# Patient Record
Sex: Male | Born: 1990 | Race: Black or African American | Hispanic: No | Marital: Single
Health system: Southern US, Community
[De-identification: ages and names within clinical notes are randomized; demographics above are authoritative.]

---

## 2015-02-22 ENCOUNTER — Emergency Department
Admission: EM | Admit: 2015-02-22 | Discharge: 2015-02-22 | Disposition: A | Discharge status: Home / Self Care | Payer: BLUE CROSS/BLUE SHIELD | Attending: Emergency Medicine | Admitting: Emergency Medicine

## 2015-02-22 ENCOUNTER — Encounter: Payer: Self-pay | Admitting: Emergency Medicine

## 2015-02-22 ENCOUNTER — Other Ambulatory Visit: Payer: Self-pay

## 2015-02-22 ENCOUNTER — Emergency Department: Payer: BLUE CROSS/BLUE SHIELD

## 2015-02-22 DIAGNOSIS — Y998 Other external cause status: Secondary | ICD-10-CM | POA: Insufficient documentation

## 2015-02-22 DIAGNOSIS — S00512A Abrasion of oral cavity, initial encounter: Secondary | ICD-10-CM | POA: Insufficient documentation

## 2015-02-22 DIAGNOSIS — R569 Unspecified convulsions: Secondary | ICD-10-CM | POA: Insufficient documentation

## 2015-02-22 DIAGNOSIS — X58XXXA Exposure to other specified factors, initial encounter: Secondary | ICD-10-CM | POA: Diagnosis not present

## 2015-02-22 DIAGNOSIS — R51 Headache: Secondary | ICD-10-CM | POA: Diagnosis not present

## 2015-02-22 DIAGNOSIS — Y9389 Activity, other specified: Secondary | ICD-10-CM | POA: Diagnosis not present

## 2015-02-22 DIAGNOSIS — Y9289 Other specified places as the place of occurrence of the external cause: Secondary | ICD-10-CM | POA: Diagnosis not present

## 2015-02-22 LAB — URINALYSIS COMPLETE WITH MICROSCOPIC (ARMC ONLY)
Bilirubin Urine: NEGATIVE
GLUCOSE, UA: NEGATIVE mg/dL
HGB URINE DIPSTICK: NEGATIVE
Ketones, ur: NEGATIVE mg/dL
Leukocytes, UA: NEGATIVE
NITRITE: NEGATIVE
Protein, ur: 30 mg/dL — AB
Specific Gravity, Urine: 1.02 (ref 1.005–1.030)
Squamous Epithelial / LPF: NONE SEEN
pH: 7 (ref 5.0–8.0)

## 2015-02-22 LAB — CBC WITH DIFFERENTIAL/PLATELET
Basophils Absolute: 0.1 10*3/uL (ref 0–0.1)
Basophils Relative: 2 %
Eosinophils Absolute: 0 10*3/uL (ref 0–0.7)
Eosinophils Relative: 1 %
HEMATOCRIT: 28.8 % — AB (ref 40.0–52.0)
HEMOGLOBIN: 8.4 g/dL — AB (ref 13.0–18.0)
LYMPHS ABS: 2.4 10*3/uL (ref 1.0–3.6)
Lymphocytes Relative: 45 %
MCH: 19.5 pg — AB (ref 26.0–34.0)
MCHC: 29.3 g/dL — AB (ref 32.0–36.0)
MCV: 66.5 fL — ABNORMAL LOW (ref 80.0–100.0)
Monocytes Absolute: 0.6 10*3/uL (ref 0.2–1.0)
Monocytes Relative: 11 %
NEUTROS ABS: 2.1 10*3/uL (ref 1.4–6.5)
Neutrophils Relative %: 41 %
Platelets: 172 10*3/uL (ref 150–440)
RBC: 4.33 MIL/uL — AB (ref 4.40–5.90)
RDW: 21.6 % — ABNORMAL HIGH (ref 11.5–14.5)
WBC: 5.1 10*3/uL (ref 3.8–10.6)

## 2015-02-22 LAB — URINE DRUG SCREEN, QUALITATIVE (ARMC ONLY)
Amphetamines, Ur Screen: NOT DETECTED
Barbiturates, Ur Screen: NOT DETECTED
Benzodiazepine, Ur Scrn: POSITIVE — AB
Cannabinoid 50 Ng, Ur ~~LOC~~: POSITIVE — AB
Cocaine Metabolite,Ur ~~LOC~~: NOT DETECTED
MDMA (ECSTASY) UR SCREEN: NOT DETECTED
Methadone Scn, Ur: NOT DETECTED
Opiate, Ur Screen: NOT DETECTED
Phencyclidine (PCP) Ur S: NOT DETECTED
TRICYCLIC, UR SCREEN: NOT DETECTED

## 2015-02-22 LAB — COMPREHENSIVE METABOLIC PANEL
ALT: 10 U/L — ABNORMAL LOW (ref 17–63)
ANION GAP: 10 (ref 5–15)
AST: 17 U/L (ref 15–41)
Albumin: 4.3 g/dL (ref 3.5–5.0)
Alkaline Phosphatase: 37 U/L — ABNORMAL LOW (ref 38–126)
BILIRUBIN TOTAL: 0.6 mg/dL (ref 0.3–1.2)
BUN: 11 mg/dL (ref 6–20)
CO2: 25 mmol/L (ref 22–32)
CREATININE: 0.97 mg/dL (ref 0.61–1.24)
Calcium: 9.1 mg/dL (ref 8.9–10.3)
Chloride: 104 mmol/L (ref 101–111)
GFR calc non Af Amer: >60 mL/min (ref >60)
Glucose, Bld: 100 mg/dL — ABNORMAL HIGH (ref 65–99)
POTASSIUM: 3.9 mmol/L (ref 3.5–5.1)
Sodium: 139 mmol/L (ref 135–145)
Total Protein: 7.7 g/dL (ref 6.5–8.1)

## 2015-02-22 NOTE — ED Notes (Signed)
Pt arrived via EMS from [facility]. Per patient , seizure lasted a couple of minutes. Fire department on arrival said patient was a little postictal. Pt is alert and oruiented at present time.

## 2015-02-22 NOTE — ED Notes (Signed)
Lab stating they do not have urine sample despite being sent by this RN at 2030. MD aware.

## 2015-02-22 NOTE — ED Notes (Signed)
Pt alert and in NAd at time of d/c to family. Pt encouraged to f/u with neurologist in home town.

## 2015-02-22 NOTE — ED Notes (Signed)
Pt sleeping soundly in NAD. Pt on cardiac monitor. Multiple family members at bedside. Pt alert and oriented when awoken.

## 2015-02-22 NOTE — ED Notes (Signed)
Per MD, RN called lab to obtain update on results for labwork, lab states they are in progress.

## 2015-02-22 NOTE — Discharge Instructions (Signed)
Seizure, Adult °A seizure is abnormal electrical activity in the brain. Seizures usually last from 30 seconds to 2 minutes. There are various types of seizures. °Before a seizure, you may have a warning sensation (aura) that a seizure is about to occur. An aura may include the following symptoms:  °· Fear or anxiety. °· Nausea. °· Feeling like the room is spinning (vertigo). °· Vision changes, such as seeing flashing lights or spots. °Common symptoms during a seizure include: °· A change in attention or behavior (altered mental status). °· Convulsions with rhythmic jerking movements. °· Drooling. °· Rapid eye movements. °· Grunting. °· Loss of bladder and bowel control. °· Bitter taste in the mouth. °· Tongue biting. °After a seizure, you may feel confused and sleepy. You may also have an injury resulting from convulsions during the seizure. °HOME CARE INSTRUCTIONS  °· If you are given medicines, take them exactly as prescribed by your health care provider. °· Keep all follow-up appointments as directed by your health care provider. °· Do not swim or drive or engage in risky activity during which a seizure could cause further injury to you or others until your health care provider says it is OK. °· Get adequate rest. °· Teach friends and family what to do if you have a seizure. They should: °· Lay you on the ground to prevent a fall. °· Put a cushion under your head. °· Loosen any tight clothing around your neck. °· Turn you on your side. If vomiting occurs, this helps keep your airway clear. °· Stay with you until you recover. °· Know whether or not you need emergency care. °SEEK IMMEDIATE MEDICAL CARE IF: °· The seizure lasts longer than 5 minutes. °· The seizure is severe or you do not wake up immediately after the seizure. °· You have an altered mental status after the seizure. °· You are having more frequent or worsening seizures. °Someone should drive you to the emergency department or call local emergency  services (911 in [place] °MAKE SURE YOU: °· Understand these instructions. °· Will watch your condition. °· Will get help right away if you are not doing well or get worse. °Document Released: 09/23/2000 Document Revised: 07/17/2013 Document Reviewed: 05/08/2013 °[employer]® Patient Information ©2015 [employer]. This information is not intended to replace advice given to you by your health care provider. Make sure you discuss any questions you have with your health care provider. ° °Driving and Equipment Restrictions °Some medical problems make it dangerous to drive, ride a bike, or use machines. Some of these problems are: °· A hard blow to the head (concussion). °· Passing out (fainting). °· Twitching and shaking (seizures). °· Low blood sugar. °· Taking medicine to help you relax (sedatives). °· Taking pain medicines. °· Wearing an eye patch. °· Wearing splints. This can make it hard to use parts of your body that you need to drive safely. °HOME CARE  °· Do not drive until your doctor says it is okay. °· Do not use machines until your doctor says it is okay. °You may need a form signed by your doctor (medical release) before you can drive again. You may also need this form before you do other tasks where you need to be fully alert. °MAKE SURE YOU: °· Understand these instructions. °· Will watch your condition. °· Will get help right away if you are not doing well or get worse. °Document Released: 11/03/2004 Document Revised: 12/19/2011 Document Reviewed: 02/03/2010 °[employer]® Patient Information ©2015 [employer].   This information is not intended to replace advice given to you by your health care provider. Make sure you discuss any questions you have with your health care provider.      As we discussed please follow-up with a neurologist as soon as possible. Please do not drive or operate machinery until you do so. Also has been noted your blood levels today were lower (hemoglobin 8.4) please follow up  with her primary care doctor regarding this as well. Return to the emergency department for any personally concerning symptoms.

## 2015-02-22 NOTE — ED Notes (Signed)
Lab stating they have urine and received it "15 minutes ago." MD aware.

## 2015-02-22 NOTE — ED Provider Notes (Signed)
[facility] Emergency Department Provider Note  Time seen: 6:15 PM  I have reviewed the triage vital signs and the nursing notes.   HISTORY  Chief Complaint Seizures    HPI Len ChildsDominique Stokes is a 24 y.o. male with no past medical history who presents the emergency department following a generalized tonic-clonic seizure. According to report per mom the patient had diffuse shaking, seizure-like activity lasting approximately 1 minute. Some confusion/sleepiness after the event. Upon EMS arrival they state the patient is awake alert oriented, no acute distress and no apparent postictal symptoms. Patient denies any history of seizure in the past. Denies any recent fever or illness. Denies any drug use other than marijuana. Denies alcohol use besides once a week. States approximately 2-3 weeks ago he had one Xanax tablet for a headache. Patient states mild headache currently, denies any focal weakness or numbness. States he feels tired otherwise himself.    No past medical history on file.  There are no active problems to display for this patient.   No past surgical history on file.  No current outpatient prescriptions on file.  Allergies Review of patient's allergies indicates not on file.  No family history on file.  Social History History  Substance Use Topics  . Smoking status: Not on file  . Smokeless tobacco: Not on file  . Alcohol Use: Not on file    Review of Systems Constitutional: Negative for fever. Eyes: Negative for visual changes. ENT: Negative for recent illness/congestion or cough. Cardiovascular: Negative for chest pain. Respiratory: Negative for shortness of breath. Gastrointestinal: Negative for abdominal pain, vomiting and diarrhea. Genitourinary: Negative for dysuria. Neurological: Positive for headache, denies any focal weakness or numbness.  10-point ROS otherwise  negative.  ____________________________________________   PHYSICAL EXAM:  VITAL SIGNS: ED Triage Vitals  Enc Vitals Group     BP --      Pulse --      Resp --      Temp --      Temp src --      SpO2 --      Weight --      Height --      Head Cir --      Peak Flow --      Pain Score --      Pain Loc --      Pain Edu? --      Excl. in GC? --     Constitutional: Alert and oriented. Well appearing and in no distress. Eyes: Normal exam, extraocular muscles intact, PERRL ENT   Head: Normocephalic and atraumatic.   Nose: No congestion/rhinnorhea.   Mouth/Throat: Mucous membranes are moist, small tongue abrasion to the right side. Cardiovascular: Normal rate, regular rhythm. No murmurs, rubs, or gallops. Respiratory: Normal respiratory effort without tachypnea nor retractions. Breath sounds are clear and equal bilaterally. No wheezes/rales/rhonchi. Gastrointestinal: Soft and nontender. No distention.   Musculoskeletal: Nontender with normal range of motion in all extremities.  Neurologic:  Normal speech and language. No gross focal neurologic deficits  Skin:  Skin is warm, dry and intact.  Psychiatric: Mood and affect are normal. Speech and behavior are normal. Patient exhibits appropriate insight and judgment.  ____________________________________________      RADIOLOGY  CT within normal limits.  ____________________________________________   INITIAL IMPRESSION / ASSESSMENT AND PLAN / ED COURSE  Pertinent labs & imaging results that were available during my care of the patient were reviewed by me and considered in my medical  decision making (see chart for details).  24 year old male with no past medical history presents after a generalized tonic-clonic seizure. Patient with no history of seizure, only complaint is a headache currently. We'll check labs and CT head to futher evaluate. Patient agreeable to plan.  EKG shows normal sinus rhythm at 75 bpm. Narrow  QRS, normal axis, normal intervals, no ST changes noted. Overall normal EKG.  Labs shows a low hemoglobin at 8.4. Rectal exam is negative for any bleeding/blood. Discussed with the patient and the patient's mother need to follow up with a neurologist. They live in [place] and would like to follow up locally in [place]. I discussed with the patient seizure precautions including no driving until they have been evaluated by a neurologist. Patient and mother are agreeable to plan. We'll discharge him.  ____________________________________________   FINAL CLINICAL IMPRESSION(S) / ED DIAGNOSES  Seizure   Minna AntisKevin Ethell Blatchford, MD 02/22/15 2153

## 2016-03-24 IMAGING — CT CT HEAD W/O CM
1 series · 16 of 30 positions shown, 20 images · non-contrast
Comparison: None.

CLINICAL DATA: Acute onset of seizures.  Initial encounter.

EXAM:
CT HEAD WITHOUT CONTRAST
TECHNIQUE: Contiguous axial images were obtained from the base of the skull
through the vertex without intravenous contrast.

[Series 2: head wo · axial · 0.47mm/px · z∈[-144,-15]mm · 16 of 30 slices shown, 20 images]
[im 2/30  brain]
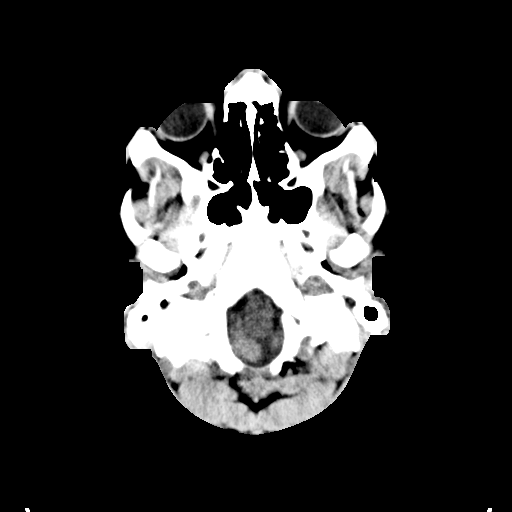
[im 2/30  bone]
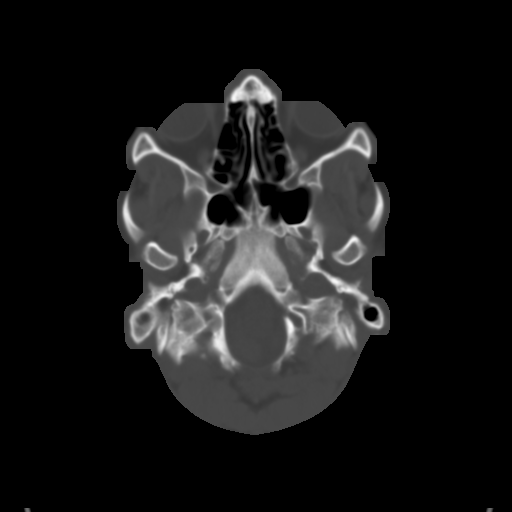
[im 4/30  brain]
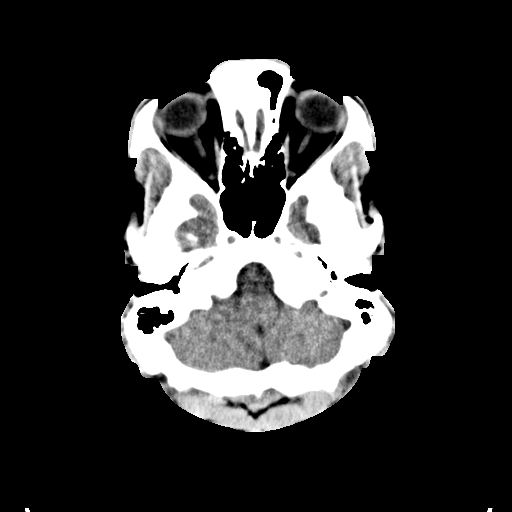
[im 6/30  brain]
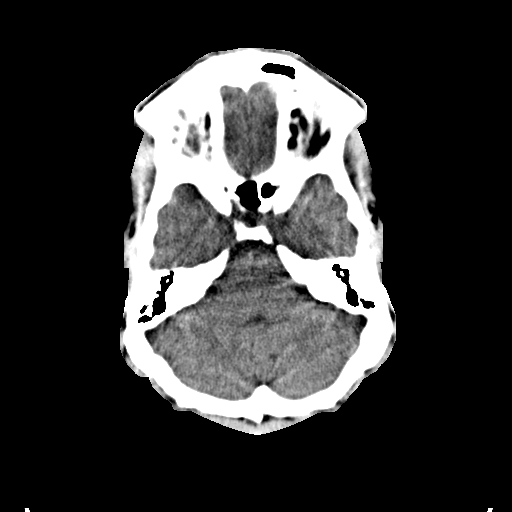
[im 8/30  brain]
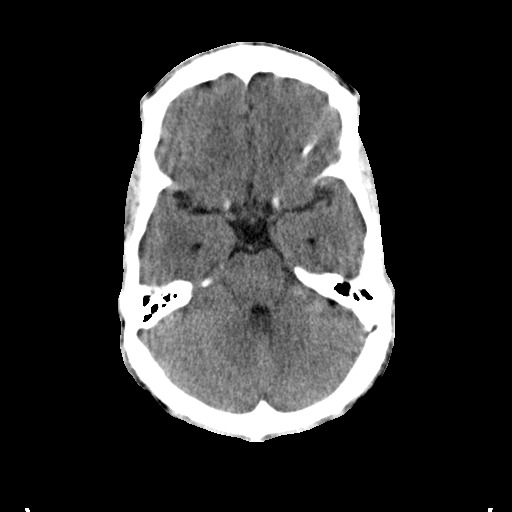
[im 9/30  brain]
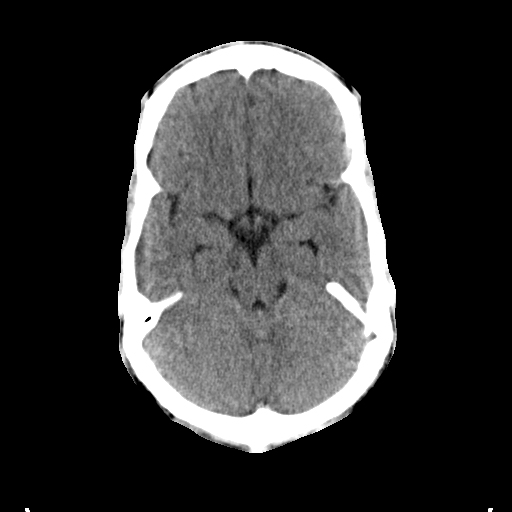
[im 9/30  bone]
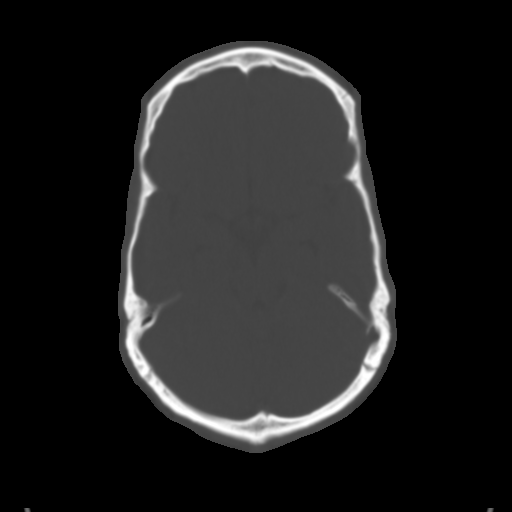
[im 11/30  brain]
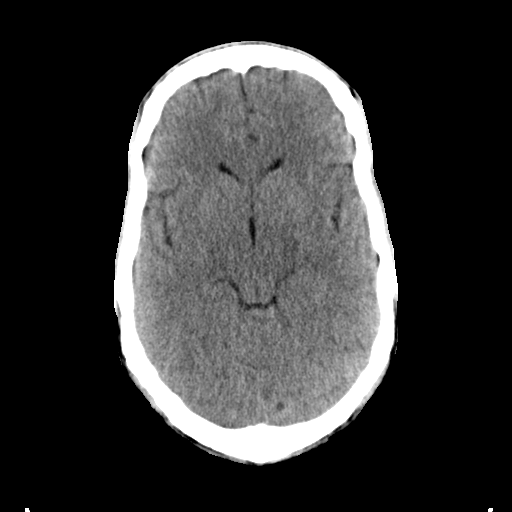
[im 13/30  brain]
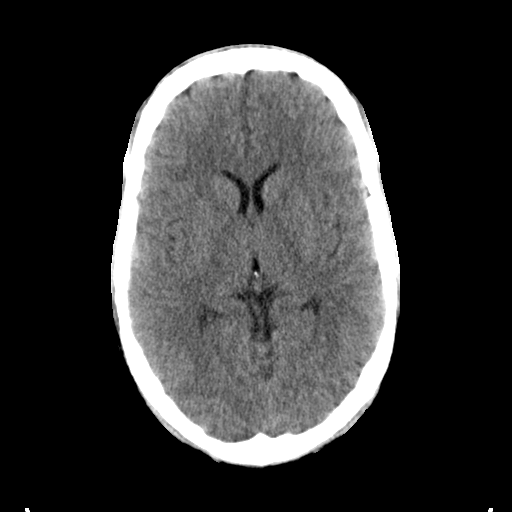
[im 15/30  brain]
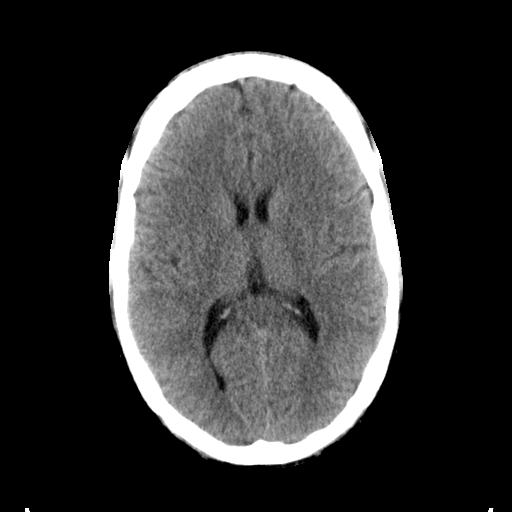
[im 16/30  brain]
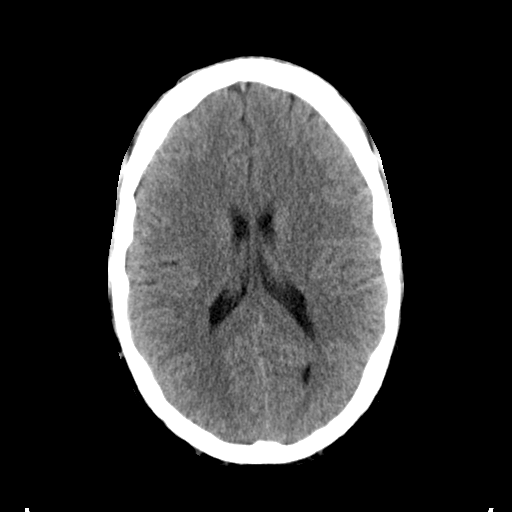
[im 16/30  bone]
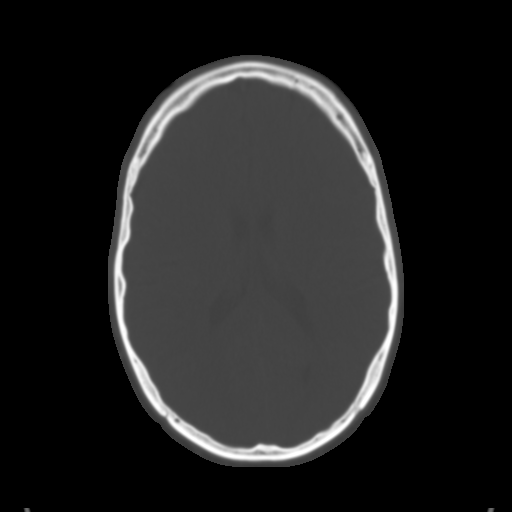
[im 18/30  brain]
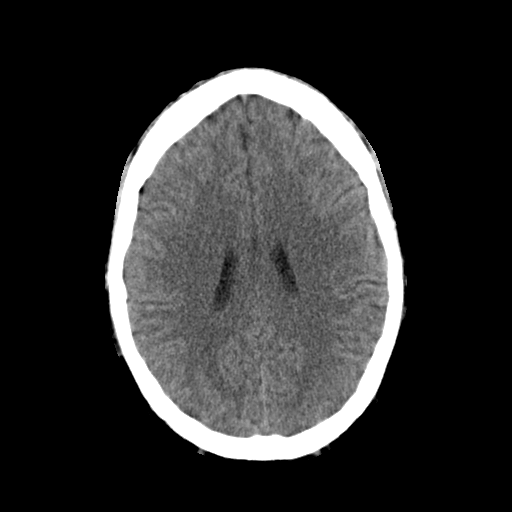
[im 20/30  brain]
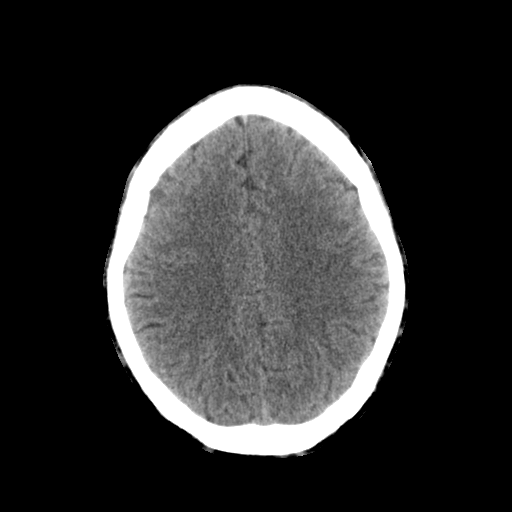
[im 22/30  brain]
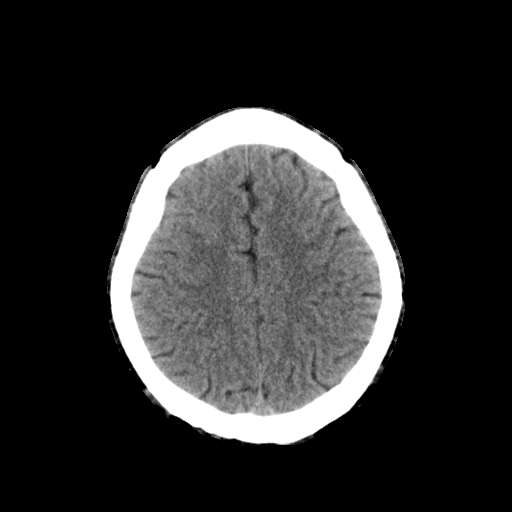
[im 23/30  brain]
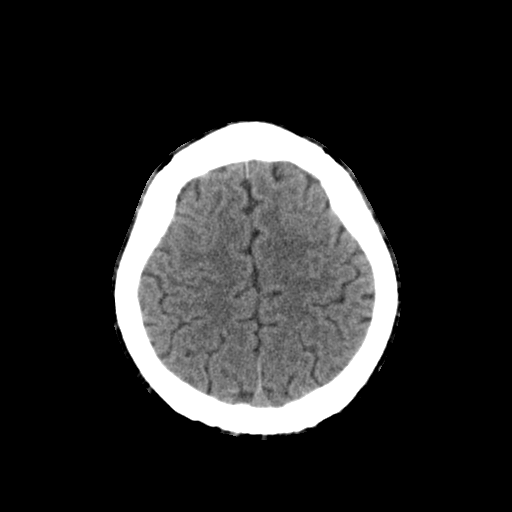
[im 23/30  bone]
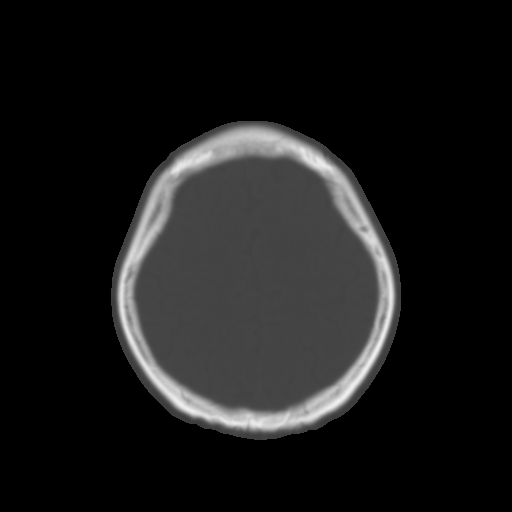
[im 25/30  brain]
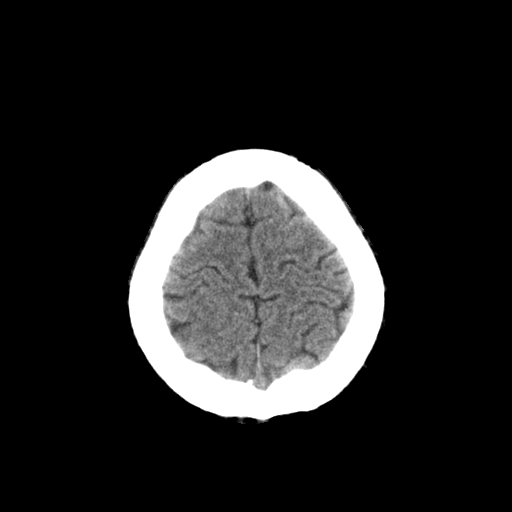
[im 27/30  brain]
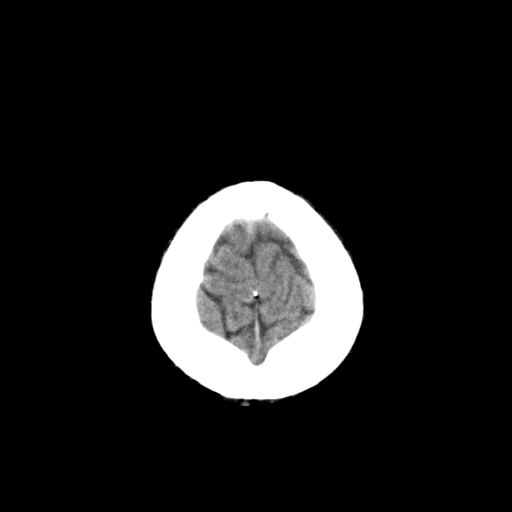
[im 29/30  brain]
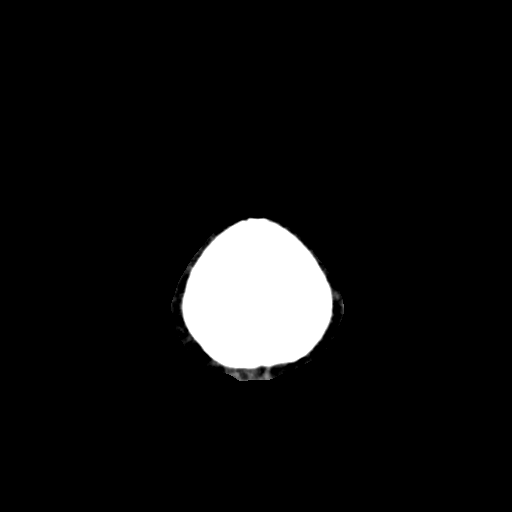

[16 of 30 positions shown; findings below may reference images not displayed]

FINDINGS: There is no evidence of acute infarction, mass lesion, or intra- or
extra-axial hemorrhage on CT.

The posterior fossa, including the cerebellum, brainstem and fourth
ventricle, is within normal limits. The third and lateral
ventricles, and basal ganglia are unremarkable in appearance. The
cerebral hemispheres are symmetric in appearance, with normal
gray-white differentiation. No mass effect or midline shift is seen.

There is no evidence of fracture; visualized osseous structures are
unremarkable in appearance. The visualized portions of the orbits
are within normal limits. The paranasal sinuses and mastoid air
cells are well-aerated. No significant soft tissue abnormalities are
seen.
IMPRESSION: Unremarkable noncontrast CT of the head.
